# Patient Record
Sex: Female | Born: 1970 | Race: Black or African American | Hispanic: No | Marital: Single | Smoking: Never smoker
Health system: Southern US, Community
[De-identification: ages and names within clinical notes are randomized; demographics above are authoritative.]

---

## 2018-08-22 DIAGNOSIS — E669 Obesity, unspecified: Secondary | ICD-10-CM | POA: Insufficient documentation

## 2018-08-22 DIAGNOSIS — Z9884 Bariatric surgery status: Secondary | ICD-10-CM | POA: Insufficient documentation

## 2019-12-17 ENCOUNTER — Ambulatory Visit
Admission: RE | Admit: 2019-12-17 | Discharge: 2019-12-17 | Disposition: A | Discharge status: Home / Self Care | Payer: No Typology Code available for payment source | Source: Ambulatory Visit

## 2019-12-17 ENCOUNTER — Other Ambulatory Visit: Payer: Self-pay

## 2019-12-17 ENCOUNTER — Ambulatory Visit (INDEPENDENT_AMBULATORY_CARE_PROVIDER_SITE_OTHER): Payer: No Typology Code available for payment source

## 2019-12-17 VITALS — BP 139/88 | HR 71 | Temp 98.5°F | Resp 18 | Ht 65.5 in | Wt 209.0 lb

## 2019-12-17 DIAGNOSIS — M25562 Pain in left knee: Secondary | ICD-10-CM | POA: Diagnosis not present

## 2019-12-17 DIAGNOSIS — M7052 Other bursitis of knee, left knee: Secondary | ICD-10-CM

## 2019-12-17 DIAGNOSIS — M25462 Effusion, left knee: Secondary | ICD-10-CM

## 2019-12-17 MED ORDER — METHYLPREDNISOLONE 4 MG PO TBPK: ORAL_TABLET | ORAL | 0 refills | Status: DC

## 2019-12-17 NOTE — ED Provider Notes (Signed)
[facility]    CSN: [id number] Arrival date & time: 12/17/19  1849      History   Chief Complaint Chief Complaint  Patient presents with   Knee Pain    HPI Carly Dixon is a 49 y.o. female.   HPI   49 year old female here for evaluation of left knee pain and swelling.  Patient reports that her symptoms started on Saturday after she wore a pair of heels to a party.  Patient ports that she did not wear them that long and is not sure if that was contributing or not.  She reports that on Sunday she got up and there was still having pain did housework all day long and by the end of the day she was having trouble bearing weight.  Patient is also complaining of some numbness and tingling in all of her toes.  Patient does not remember any injuries.  History reviewed. No pertinent past medical history.  There are no problems to display for this patient.   History reviewed. No pertinent surgical history.  OB History   No obstetric history on file.      Home Medications    Prior to Admission medications   Medication Sig Start Date End Date Taking? Authorizing Provider  phentermine (ADIPEX-P) 37.5 MG tablet Take by mouth. 12/06/19 03/05/20 Yes [provider]  cetirizine (ZYRTEC) 10 MG tablet Take by mouth.    [provider]  Cholecalciferol 125 MCG (5000 UT) TABS     [provider]  methylPREDNISolone (MEDROL DOSEPAK) 4 MG TBPK tablet Take according to the package insert 12/17/19   Becky Augusta, NP  topiramate (TOPAMAX) 100 MG tablet Take 100 mg by mouth daily. 12/08/19   [provider]    Family History History reviewed. No pertinent family history.  Social History Social History   Tobacco Use   Smoking status: Never Smoker   Smokeless tobacco: Never Used  Building services engineer Use: Never used  Substance Use Topics   Alcohol use: Yes   Drug use: Never     Allergies   Patient has no known  allergies.   Review of Systems Review of Systems  Constitutional: Negative for activity change and fever.  Musculoskeletal: Positive for arthralgias and myalgias.  Skin: Negative for color change and rash.  Neurological: Positive for numbness.  Hematological: Negative.   Psychiatric/Behavioral: Negative.      Physical Exam Triage Vital Signs ED Triage Vitals  Enc Vitals Group     BP 12/17/19 1952 139/88     Pulse Rate 12/17/19 1952 71     Resp 12/17/19 1952 18     Temp 12/17/19 1952 98.5 F (36.9 C)     Temp Source 12/17/19 1952 Oral     SpO2 12/17/19 1952 98 %     Weight 12/17/19 1949 209 lb (94.8 kg)     Height 12/17/19 1949 5' 5.5" (1.664 m)     Head Circumference --      Peak Flow --      Pain Score 12/17/19 1949 5     Pain Loc --      Pain Edu? --      Excl. in GC? --    No data found.  Updated Vital Signs BP 139/88 (BP Location: Right Arm)    Pulse 71    Temp 98.5 F (36.9 C) (Oral)    Resp 18    Ht 5' 5.5" (1.664 m)    [employer]  209 lb (94.8 kg)    LMP 12/07/2019    SpO2 98%    BMI 34.25 kg/m   Visual Acuity Right Eye Distance:   Left Eye Distance:   Bilateral Distance:    Right Eye Near:   Left Eye Near:    Bilateral Near:     Physical Exam Vitals and nursing note reviewed.  Constitutional:      General: She is not in acute distress.    Appearance: Normal appearance.  HENT:     Head: Normocephalic and atraumatic.  Musculoskeletal:        General: Swelling and tenderness present. No signs of injury. Normal range of motion.  Skin:    General: Skin is warm and dry.     Capillary Refill: Capillary refill takes less than 2 seconds.     Findings: No erythema.  Neurological:     General: No focal deficit present.     Mental Status: She is alert and oriented to person, place, and time.  Psychiatric:        Mood and Affect: Mood normal.        Behavior: Behavior normal.        Thought Content: Thought content normal.        Judgment: Judgment normal.       UC Treatments / Results  Labs (all labs ordered are listed, but only abnormal results are displayed) Labs Reviewed - No data to display  EKG   Radiology DG Knee Complete 4 Views Left  Result Date: 12/17/2019 CLINICAL DATA:  Left knee pain and swelling EXAM: LEFT KNEE - COMPLETE 4+ VIEW COMPARISON:  None. FINDINGS: Four view radiograph left knee demonstrates normal alignment. No fracture or dislocation. Joint spaces are preserved. Moderate left knee effusion is present. Mild degenerative enthesopathy involving the insertion of the quadriceps tendon upon the patella is noted. Soft tissues are otherwise unremarkable. IMPRESSION: Moderate effusion.  No acute fracture or dislocation. Electronically Signed   By: Helyn Numbers MD   On: 12/17/2019 20:17    Procedures Procedures (including critical care time)  Medications Ordered in UC Medications - No data to display  Initial Impression / Assessment and Plan / UC Course  I have reviewed the triage vital signs and the nursing notes.  Pertinent labs & imaging results that were available during my care of the patient were reviewed by me and considered in my medical decision making (see chart for details).   Patient is here for evaluation of left knee pain and swelling that started 2 days ago.  She does not member any injuries.  Patient has no tenderness with varus or valgus stress, anterior posterior drawer negative, no patellar tenderness, no tenderness along patella tendon, patient does have tenderness and swelling over the infrapatellar bursa.  Symptoms are consistent with bursitis.  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   Final Clinical Impressions(s) / UC Diagnoses   Final diagnoses:  Infrapatellar bursitis of left knee     Discharge Instructions     Take the Medrol Dosepak according to the package insert.  Keep your left knee elevated as much as  possible.  Wearing a compression sleeve may help provide some comfort.  Moist heat applied for 20 minutes at a time 2-3 times a day can also help improve healing.  If your symptoms continue follow-up with your primary care provider.    ED Prescriptions    Medication  Sig Dispense Auth. Provider   methylPREDNISolone (MEDROL DOSEPAK) 4 MG TBPK tablet Take according to the package insert 1 each Becky Augusta, NP     PDMP not reviewed this encounter.   Becky Augusta, NP 12/17/19 2036

## 2019-12-17 NOTE — Discharge Instructions (Addendum)
Take the Medrol Dosepak according to the package insert.  Keep your left knee elevated as much as possible.  Wearing a compression sleeve may help provide some comfort.  Moist heat applied for 20 minutes at a time 2-3 times a day can also help improve healing.  If your symptoms continue follow-up with your primary care provider.

## 2019-12-17 NOTE — ED Triage Notes (Signed)
Patient c/o left knee swelling and pain that started yesterday morning. Denies injury.

## 2019-12-18 ENCOUNTER — Telehealth: Payer: Self-pay

## 2019-12-18 MED ORDER — METHYLPREDNISOLONE 4 MG PO TBPK: ORAL_TABLET | ORAL | 0 refills | Status: AC

## 2020-11-19 ENCOUNTER — Encounter: Payer: Self-pay | Admitting: Podiatry

## 2020-11-19 ENCOUNTER — Other Ambulatory Visit: Payer: Self-pay

## 2020-11-19 ENCOUNTER — Ambulatory Visit: Payer: No Typology Code available for payment source | Admitting: Podiatry

## 2020-11-19 ENCOUNTER — Ambulatory Visit (INDEPENDENT_AMBULATORY_CARE_PROVIDER_SITE_OTHER): Payer: No Typology Code available for payment source

## 2020-11-19 ENCOUNTER — Ambulatory Visit (INDEPENDENT_AMBULATORY_CARE_PROVIDER_SITE_OTHER): Payer: No Typology Code available for payment source | Admitting: Podiatry

## 2020-11-19 DIAGNOSIS — N920 Excessive and frequent menstruation with regular cycle: Secondary | ICD-10-CM | POA: Insufficient documentation

## 2020-11-19 DIAGNOSIS — M21619 Bunion of unspecified foot: Secondary | ICD-10-CM

## 2020-11-19 DIAGNOSIS — Z23 Encounter for immunization: Secondary | ICD-10-CM | POA: Insufficient documentation

## 2020-11-19 DIAGNOSIS — E669 Obesity, unspecified: Secondary | ICD-10-CM | POA: Insufficient documentation

## 2020-11-19 DIAGNOSIS — J309 Allergic rhinitis, unspecified: Secondary | ICD-10-CM | POA: Insufficient documentation

## 2020-11-19 DIAGNOSIS — E785 Hyperlipidemia, unspecified: Secondary | ICD-10-CM | POA: Insufficient documentation

## 2020-11-19 DIAGNOSIS — R1310 Dysphagia, unspecified: Secondary | ICD-10-CM | POA: Insufficient documentation

## 2020-11-19 DIAGNOSIS — E559 Vitamin D deficiency, unspecified: Secondary | ICD-10-CM | POA: Insufficient documentation

## 2020-11-19 DIAGNOSIS — M2011 Hallux valgus (acquired), right foot: Secondary | ICD-10-CM | POA: Diagnosis not present

## 2020-11-19 DIAGNOSIS — M21611 Bunion of right foot: Secondary | ICD-10-CM

## 2020-11-19 DIAGNOSIS — G43909 Migraine, unspecified, not intractable, without status migrainosus: Secondary | ICD-10-CM | POA: Insufficient documentation

## 2020-11-19 NOTE — Progress Notes (Signed)
  Subjective:  Patient ID: Carly Dixon, female    DOB: 1970-09-11,  MRN: [id number]  Chief Complaint  Patient presents with   Bunions    np-bunion right foot-req day-[facility] refer    50 y.o. female presents with the above complaint. History confirmed with patient.  Previously had bunion surgery [facility] in [place].  Says she was in a cast after surgery.  The bunion has recurred and is causing pain over the bony bump and a callus to form  Objective:  Physical Exam: warm, good capillary refill, no trophic changes or ulcerative lesions, normal DP and PT pulses, and normal sensory exam. Left Foot: bunion deformity noted and it is not tender Right Foot:  Recurrent hallux valgus and bunion formation with hyperkeratosis medially  No images are attached to the encounter.  Radiographs: Multiple views x-ray of the right foot: no fracture, dislocation, swelling or degenerative changes noted and prior Lapidus bunionectomy, some recurrence and large bony formation medial metatarsal head with hallux interphalangeus Assessment:   1. Bunion   2. Hallux interphalangeus, acquired, right      Plan:  Patient was evaluated and treated and all questions answered.  Reviewed the radiographs with her in detail as well as my clinical exam findings.  She does have recurrent bunion.  Overall think her initial correction was fairly good but she has had recurrence over time.  New bony formation.  Think likely would recommend if she wants to proceed with correction that is minimally invasive approach with distal metatarsal osteotomy and possible Akin osteotomy straighten the toe out would be best.  Do not think Lapidus needs to be redone.  She is a Engineer, civil (consulting) at [employer] and will discuss with her job as far as when she likely would need to be off I told her she likely would need to be in a boot for 6 to 8 weeks.  Hopefully she can get back to work sooner than that with lighter duty as a IT trainer.  She will let me know if she decides and returns to me for presurgical planning visit  No follow-ups on file.

## 2020-12-04 DEATH — deceased

## 2020-12-10 ENCOUNTER — Ambulatory Visit (INDEPENDENT_AMBULATORY_CARE_PROVIDER_SITE_OTHER): Payer: No Typology Code available for payment source | Admitting: Podiatry

## 2020-12-10 ENCOUNTER — Other Ambulatory Visit: Payer: Self-pay

## 2020-12-10 DIAGNOSIS — M21619 Bunion of unspecified foot: Secondary | ICD-10-CM | POA: Diagnosis not present

## 2020-12-10 DIAGNOSIS — M2011 Hallux valgus (acquired), right foot: Secondary | ICD-10-CM | POA: Diagnosis not present

## 2020-12-11 NOTE — Progress Notes (Signed)
  Subjective:  Patient ID: Carly Dixon, female    DOB: 01/04/1971,  MRN: [id number]  Chief Complaint  Patient presents with   Bunions     surgery consult     50 y.o. female presents with the above complaint. History confirmed with patient.  Previously had bunion surgery [facility] in [place].  Says she was in a cast after surgery.  The bunion has recurred and is causing pain over the bony bump and a callus to form  Bunion is still painful she is ready to schedule surgery.  Objective:  Physical Exam: warm, good capillary refill, no trophic changes or ulcerative lesions, normal DP and PT pulses, and normal sensory exam. Left Foot: bunion deformity noted and it is not tender Right Foot:  Recurrent hallux valgus and bunion formation with hyperkeratosis medially  No images are attached to the encounter.  Radiographs: Multiple views x-ray of the right foot: no fracture, dislocation, swelling or degenerative changes noted and prior Lapidus bunionectomy, some recurrence and large bony formation medial metatarsal head with hallux interphalangeus Assessment:   1. Bunion   2. Hallux interphalangeus, acquired, right      Plan:  Patient was evaluated and treated and all questions answered.  Discussed the etiology and treatment including surgical and non surgical treatment for painful recurrent bunion.  She has exhausted all non surgical treatment prior to this visit including shoe gear changes and padding.  She desires surgical intervention. We discussed all risks including but not limited to: pain, swelling, infection, scar, numbness which may be temporary or permanent, chronic pain, stiffness, nerve pain or damage, wound healing problems, bone healing problems including delayed or non-union and recurrence. Specifically we discussed the following procedures: Distal metatarsal and Akin osteotomy through minimally invasive approach. Informed consent was signed today. Surgery will be  scheduled at a mutually agreeable date. Information regarding this will be forwarded to our surgery scheduler.   Surgical plan:  Procedure: -Distal metatarsal and Akin osteotomy through minimally invasive approach  Location: -[facility]  Anesthesia plan: -IV sedation with block  Postoperative pain plan: - Tylenol 1000 mg every 6 hours, ibuprofen 600 mg every 6 hours, gabapentin 300 mg every 8 hours x5 days, oxycodone 5 mg 1-2 tabs every 6 hours only as needed  DVT prophylaxis: -None required  WB Restrictions / DME needs: -WBAT in CAM boot which we dispense at surgery center  No follow-ups on file.

## 2021-01-01 DIAGNOSIS — M79676 Pain in unspecified toe(s): Secondary | ICD-10-CM

## 2021-01-28 ENCOUNTER — Encounter: Payer: No Typology Code available for payment source | Admitting: Podiatry

## 2021-02-11 ENCOUNTER — Encounter: Payer: No Typology Code available for payment source | Admitting: Podiatry

## 2021-02-11 ENCOUNTER — Telehealth: Payer: Self-pay

## 2021-02-11 NOTE — Telephone Encounter (Signed)
Carly Dixon called to cancel her surgery with Dr. Sherryle Lis on 02/20/2021. She stated she just can't be out of work and without pay at this time. She will call back when she is ready to reschedule. Notified Dr. Sherryle Lis and Caren Griffins with [facility].

## 2021-02-25 ENCOUNTER — Encounter: Payer: No Typology Code available for payment source | Admitting: Podiatry

## 2021-03-04 ENCOUNTER — Encounter: Payer: No Typology Code available for payment source | Admitting: Podiatry

## 2021-03-11 ENCOUNTER — Encounter: Payer: No Typology Code available for payment source | Admitting: Podiatry

## 2021-04-01 ENCOUNTER — Encounter: Payer: No Typology Code available for payment source | Admitting: Podiatry

## 2022-01-12 IMAGING — CR DG KNEE COMPLETE 4+V*L*
4 series · 4 of 4 positions shown · non-contrast
Comparison: None.

CLINICAL DATA: Left knee pain and swelling

EXAM:
LEFT KNEE - COMPLETE 4+ VIEW

[knee ap]
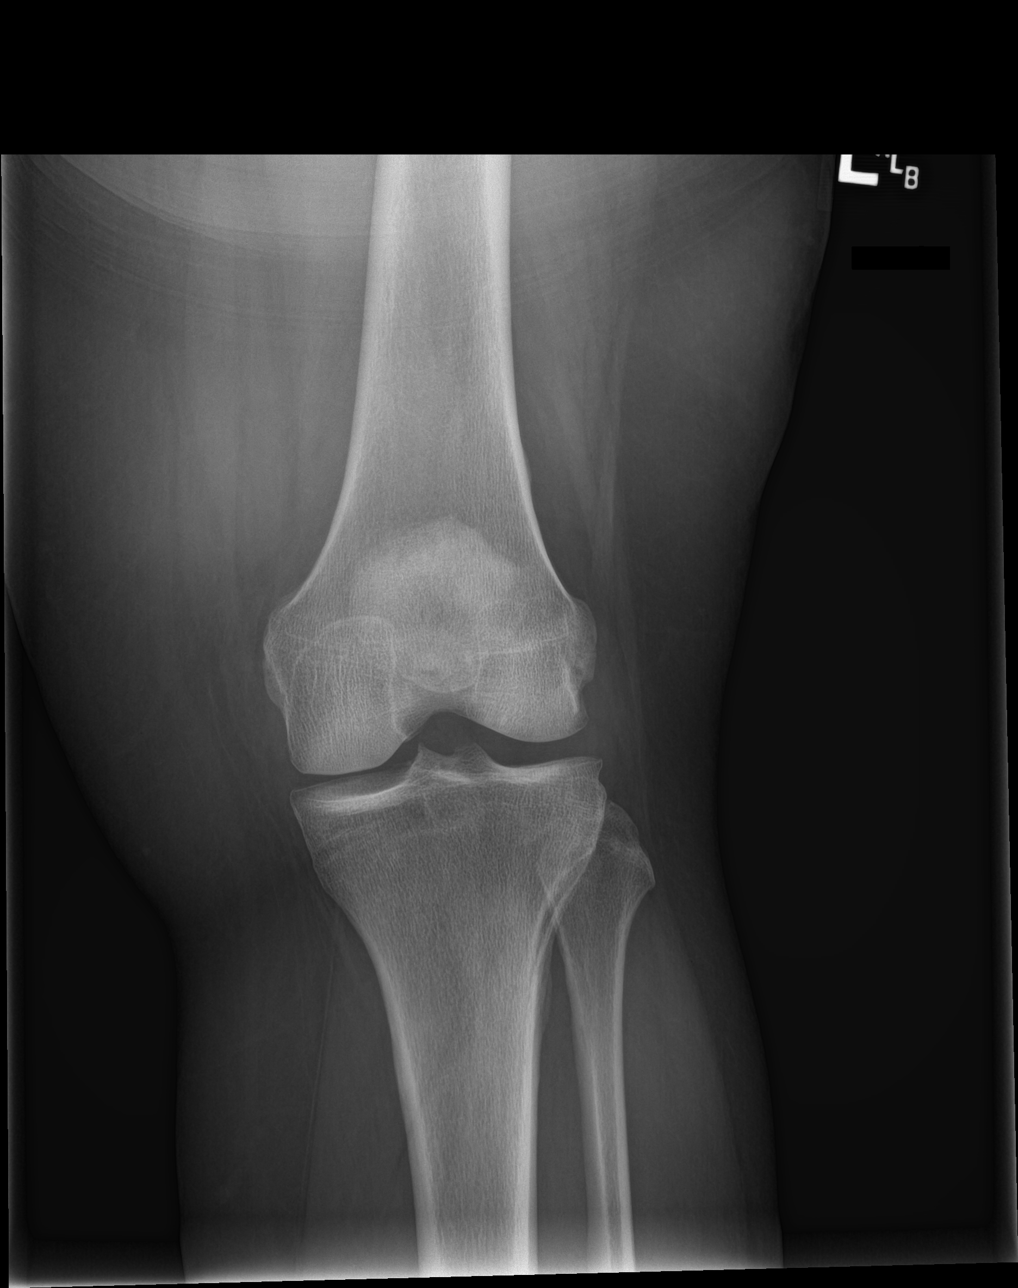

[knee lat]
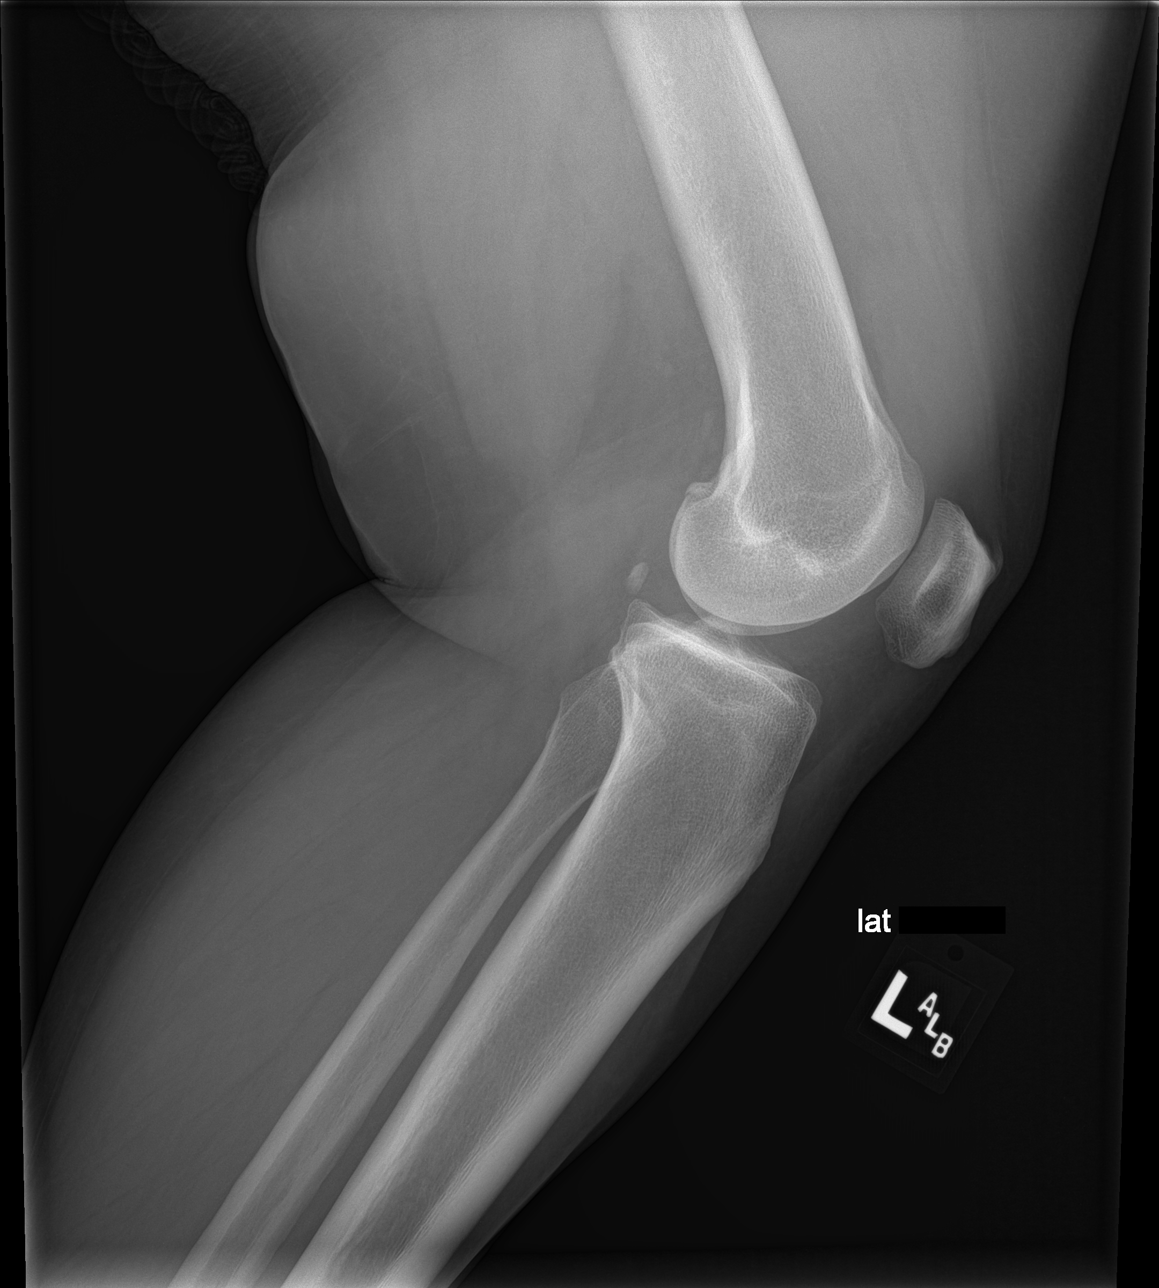

[tunnel]
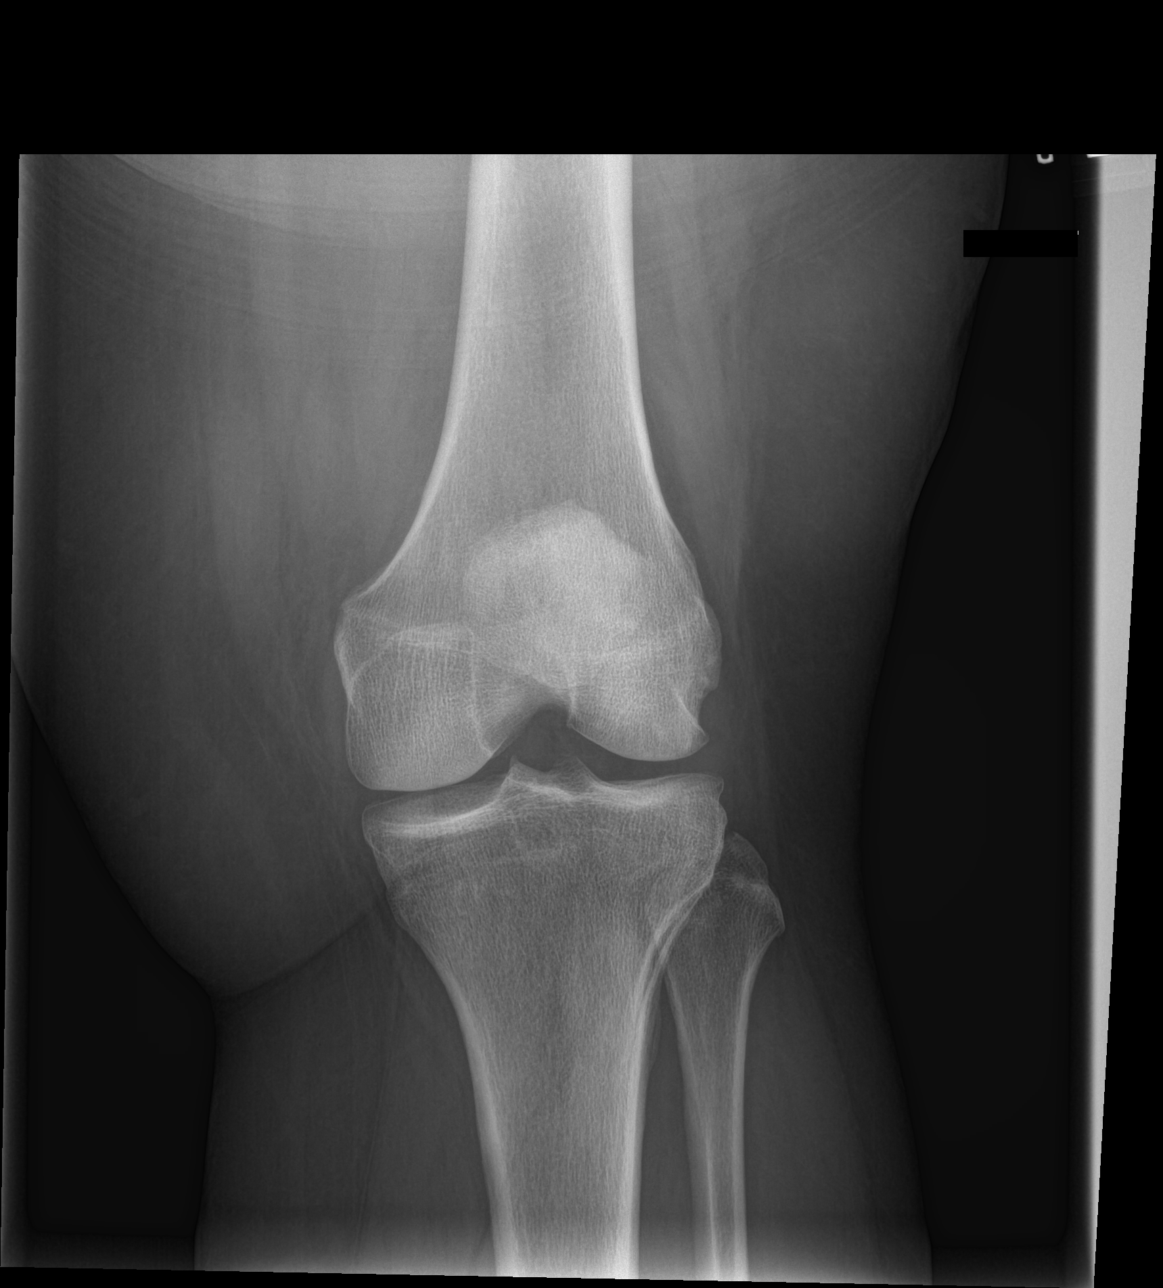

[patella skyline]
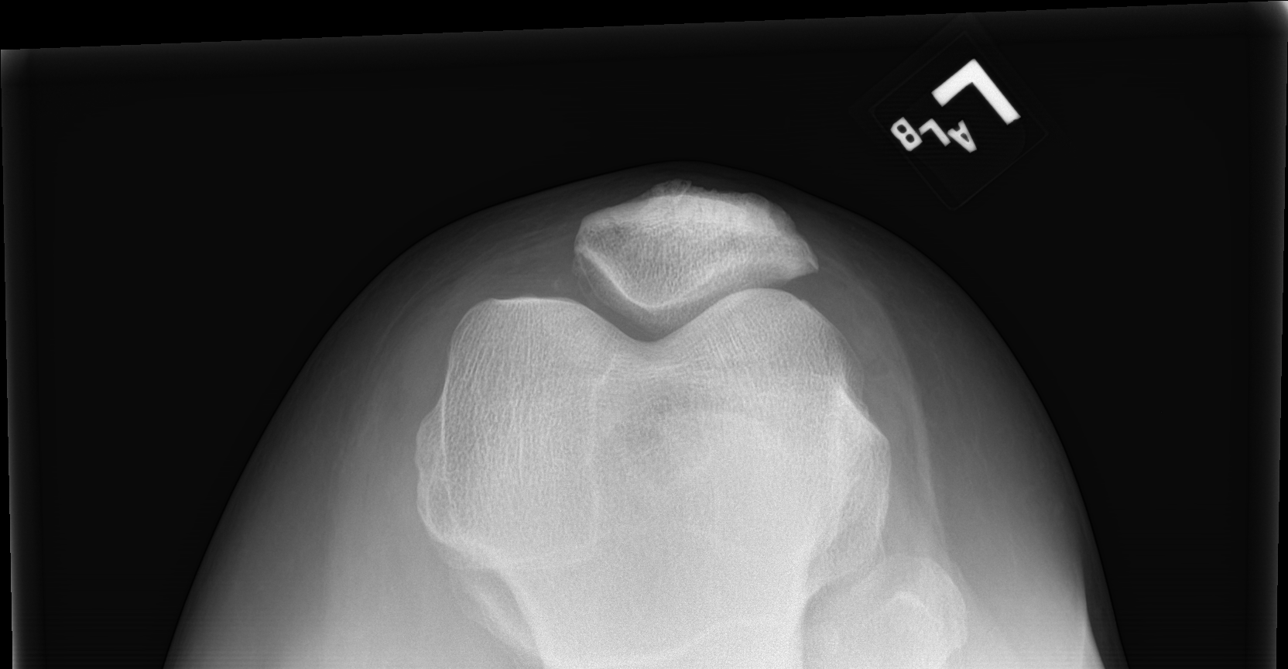

[4 of 4 positions shown; findings below may reference images not displayed]

FINDINGS: Four view radiograph left knee demonstrates normal alignment. No
fracture or dislocation. Joint spaces are preserved. Moderate left
knee effusion is present. Mild degenerative enthesopathy involving
the insertion of the quadriceps tendon upon the patella is noted.
Soft tissues are otherwise unremarkable.
IMPRESSION: Moderate effusion.  No acute fracture or dislocation.
